# Patient Record
Sex: Female | Born: 1975 | Race: Black or African American | Hispanic: No | Smoking: Current every day smoker
Health system: Southern US, Community
[De-identification: ages and names within clinical notes are randomized; demographics above are authoritative.]

## PROBLEM LIST (undated history)

## (undated) DIAGNOSIS — J45909 Unspecified asthma, uncomplicated: Secondary | ICD-10-CM

## (undated) HISTORY — PX: APPENDECTOMY: SHX54

---

## 2005-03-06 ENCOUNTER — Emergency Department (HOSPITAL_COMMUNITY): Admission: EM | Admit: 2005-03-06 | Discharge: 2005-03-06 | Payer: Self-pay | Admitting: Family Medicine

## 2006-03-03 ENCOUNTER — Emergency Department (HOSPITAL_COMMUNITY): Admission: EM | Admit: 2006-03-03 | Discharge: 2006-03-03 | Payer: Self-pay | Admitting: Emergency Medicine

## 2006-07-30 ENCOUNTER — Emergency Department (HOSPITAL_COMMUNITY): Admission: EM | Admit: 2006-07-30 | Discharge: 2006-07-30 | Payer: Self-pay | Admitting: Emergency Medicine

## 2007-01-06 ENCOUNTER — Emergency Department (HOSPITAL_COMMUNITY): Admission: EM | Admit: 2007-01-06 | Discharge: 2007-01-07 | Payer: Self-pay | Admitting: Emergency Medicine

## 2011-01-21 ENCOUNTER — Inpatient Hospital Stay (HOSPITAL_COMMUNITY)
Admission: AD | Admit: 2011-01-21 | Discharge: 2011-01-22 | Disposition: A | Discharge status: Home / Self Care | Payer: Self-pay | Source: Ambulatory Visit | Attending: Obstetrics & Gynecology | Admitting: Obstetrics & Gynecology

## 2011-01-21 DIAGNOSIS — O210 Mild hyperemesis gravidarum: Secondary | ICD-10-CM

## 2011-01-21 LAB — URINALYSIS, ROUTINE W REFLEX MICROSCOPIC
Hgb urine dipstick: NEGATIVE
Ketones, ur: NEGATIVE mg/dL
Nitrite: NEGATIVE
Protein, ur: NEGATIVE mg/dL
Specific Gravity, Urine: <1.005 — ABNORMAL LOW (ref 1.005–1.030)
Urine Glucose, Fasting: NEGATIVE mg/dL
Urobilinogen, UA: 0.2 mg/dL (ref 0.0–1.0)

## 2011-01-21 LAB — POCT PREGNANCY, URINE: Preg Test, Ur: POSITIVE

## 2011-02-25 ENCOUNTER — Other Ambulatory Visit (HOSPITAL_COMMUNITY): Payer: Self-pay | Admitting: Obstetrics and Gynecology

## 2011-02-25 DIAGNOSIS — R772 Abnormality of alphafetoprotein: Secondary | ICD-10-CM

## 2011-02-25 DIAGNOSIS — Z0489 Encounter for examination and observation for other specified reasons: Secondary | ICD-10-CM

## 2011-03-15 ENCOUNTER — Other Ambulatory Visit (HOSPITAL_COMMUNITY): Payer: Self-pay | Admitting: Obstetrics and Gynecology

## 2011-03-15 ENCOUNTER — Ambulatory Visit (HOSPITAL_COMMUNITY)
Admission: RE | Admit: 2011-03-15 | Discharge: 2011-03-15 | Disposition: A | Discharge status: Home / Self Care | Payer: Medicaid Other | Source: Ambulatory Visit | Attending: *Deleted | Admitting: *Deleted

## 2011-03-15 ENCOUNTER — Ambulatory Visit (HOSPITAL_COMMUNITY)
Admission: RE | Admit: 2011-03-15 | Discharge: 2011-03-15 | Disposition: A | Discharge status: Home / Self Care | Payer: Medicaid Other | Source: Ambulatory Visit | Attending: Obstetrics and Gynecology | Admitting: Obstetrics and Gynecology

## 2011-03-15 DIAGNOSIS — Z363 Encounter for antenatal screening for malformations: Secondary | ICD-10-CM | POA: Insufficient documentation

## 2011-03-15 DIAGNOSIS — R772 Abnormality of alphafetoprotein: Secondary | ICD-10-CM

## 2011-03-15 DIAGNOSIS — Z0489 Encounter for examination and observation for other specified reasons: Secondary | ICD-10-CM

## 2011-03-15 DIAGNOSIS — Z1389 Encounter for screening for other disorder: Secondary | ICD-10-CM | POA: Insufficient documentation

## 2011-03-15 DIAGNOSIS — O358XX0 Maternal care for other (suspected) fetal abnormality and damage, not applicable or unspecified: Secondary | ICD-10-CM | POA: Insufficient documentation

## 2011-03-15 DIAGNOSIS — O289 Unspecified abnormal findings on antenatal screening of mother: Secondary | ICD-10-CM | POA: Insufficient documentation

## 2011-03-23 ENCOUNTER — Other Ambulatory Visit (HOSPITAL_COMMUNITY): Payer: Medicaid Other

## 2015-03-25 ENCOUNTER — Encounter (HOSPITAL_BASED_OUTPATIENT_CLINIC_OR_DEPARTMENT_OTHER): Payer: Self-pay | Admitting: *Deleted

## 2015-03-25 ENCOUNTER — Emergency Department (HOSPITAL_BASED_OUTPATIENT_CLINIC_OR_DEPARTMENT_OTHER)
Admission: EM | Admit: 2015-03-25 | Discharge: 2015-03-25 | Disposition: A | Discharge status: Home / Self Care | Payer: 59 | Attending: Emergency Medicine | Admitting: Emergency Medicine

## 2015-03-25 DIAGNOSIS — Z720 Tobacco use: Secondary | ICD-10-CM | POA: Diagnosis not present

## 2015-03-25 DIAGNOSIS — B349 Viral infection, unspecified: Secondary | ICD-10-CM | POA: Diagnosis not present

## 2015-03-25 DIAGNOSIS — R05 Cough: Secondary | ICD-10-CM | POA: Diagnosis present

## 2015-03-25 MED ORDER — ACETAMINOPHEN 500 MG PO TABS
1000.0000 mg | ORAL_TABLET | Freq: Once | ORAL | Status: AC
  Administered 2015-03-25: 1000 mg via ORAL
  Filled 2015-03-25: qty 2

## 2015-03-25 MED ORDER — FLUTICASONE PROPIONATE 50 MCG/ACT NA SUSP: 2.0000 | Freq: Every day | NASAL | Status: AC

## 2015-03-25 MED ORDER — LORATADINE 10 MG PO TABS
10.0000 mg | ORAL_TABLET | Freq: Once | ORAL | Status: AC
  Administered 2015-03-25: 10 mg via ORAL
  Filled 2015-03-25: qty 1

## 2015-03-25 MED ORDER — NAPROXEN 375 MG PO TABS: 375.0000 mg | ORAL_TABLET | Freq: Two times a day (BID) | ORAL | Status: AC

## 2015-03-25 MED ORDER — CETIRIZINE-PSEUDOEPHEDRINE ER 5-120 MG PO TB12: 1.0000 | ORAL_TABLET | Freq: Every day | ORAL | Status: AC

## 2015-03-25 MED ORDER — BENZONATATE 100 MG PO CAPS: 100.0000 mg | ORAL_CAPSULE | Freq: Three times a day (TID) | ORAL | Status: DC

## 2015-03-25 MED ORDER — BENZONATATE 100 MG PO CAPS
200.0000 mg | ORAL_CAPSULE | Freq: Once | ORAL | Status: AC
  Administered 2015-03-25: 200 mg via ORAL
  Filled 2015-03-25: qty 2

## 2015-03-25 MED ORDER — NAPROXEN 250 MG PO TABS
500.0000 mg | ORAL_TABLET | Freq: Once | ORAL | Status: AC
  Administered 2015-03-25: 500 mg via ORAL
  Filled 2015-03-25: qty 2

## 2015-03-25 NOTE — ED Notes (Signed)
Pt here with significant other and child, the whole family has URI.  Congestion and fever and chills

## 2015-03-25 NOTE — ED Provider Notes (Signed)
CSN: [id number]     Arrival date & time 03/25/15  0033 History   First MD Initiated Contact with Patient 03/25/15 0325     Chief Complaint  Patient presents with  . URI     (Consider location/radiation/quality/duration/timing/severity/associated sxs/prior Treatment) Patient is a 39 y.o. female presenting with URI. The history is provided by the patient.  URI Presenting symptoms: congestion, cough and rhinorrhea   Presenting symptoms comment:  Myalgias Congestion:    Location:  Nasal Cough:    Cough characteristics:  Non-productive   Severity:  Moderate   Onset quality:  Gradual   Duration:  3 days   Timing:  Intermittent   Progression:  Unchanged   Chronicity:  New Severity:  Moderate Onset quality:  Gradual Timing:  Constant Progression:  Unchanged Chronicity:  New Relieved by:  Nothing Worsened by:  Nothing tried Associated symptoms: myalgias and sneezing   Associated symptoms: no swollen glands and no wheezing   Risk factors: sick contacts   family members here with same.    History reviewed. No pertinent past medical history. History reviewed. No pertinent past surgical history. History reviewed. No pertinent family history. History  Substance Use Topics  . Smoking status: Current Every Day Smoker  . Smokeless tobacco: Not on file  . Alcohol Use: No   OB History    No data available     Review of Systems  HENT: Positive for congestion, rhinorrhea and sneezing. Negative for drooling.   Eyes: Negative for photophobia.  Respiratory: Positive for cough. Negative for wheezing.   Musculoskeletal: Positive for myalgias.  All other systems reviewed and are negative.     Allergies  Review of patient's allergies indicates no known allergies.  Home Medications   Prior to Admission medications   Not on File   BP 115/76 mmHg  Pulse 99  Temp(Src) 98.5 F (36.9 C)  Resp 22  Ht 5\' 3"  (1.6 m)  Wt 174 lb (78.926 kg)  BMI 30.83 kg/m2  SpO2 96%  LMP  03/18/2015 Physical Exam  Constitutional: She is oriented to person, place, and time. She appears well-developed. No distress.  HENT:  Head: Normocephalic and atraumatic.  Right Ear: External ear normal.  Left Ear: External ear normal.  Nose: Rhinorrhea present.  Mouth/Throat: Oropharynx is clear and moist.  Tm normal B  Eyes: Conjunctivae and EOM are normal. Pupils are equal, round, and reactive to light.  Neck: Normal range of motion. Neck supple.  Cardiovascular: Normal rate, regular rhythm and intact distal pulses.   Pulmonary/Chest: Effort normal and breath sounds normal. No stridor. No respiratory distress. She has no wheezes. She has no rales.  Abdominal: Soft. Bowel sounds are normal. There is no tenderness. There is no rebound and no guarding.  Musculoskeletal: Normal range of motion.  Lymphadenopathy:    She has no cervical adenopathy.  Neurological: She is alert and oriented to person, place, and time.  Skin: Skin is warm and dry.  Psychiatric: She has a normal mood and affect.    ED Course  Procedures (including critical care time) Labs Review Labs Reviewed - No data to display  Imaging Review No results found.   EKG Interpretation None      MDM   Final diagnoses:  None    Viral syndrome.  Well appearing.  Exam and vitals are normal.  Will treat symptomatically.    Cy BlamerApril Rawley Harju, MD 03/25/15 [phone]

## 2015-03-25 NOTE — ED Notes (Signed)
C/o cough, congestion body aches fever 101.5 onset monday

## 2016-02-27 ENCOUNTER — Encounter (HOSPITAL_BASED_OUTPATIENT_CLINIC_OR_DEPARTMENT_OTHER): Payer: Self-pay | Admitting: Emergency Medicine

## 2016-02-27 ENCOUNTER — Emergency Department (HOSPITAL_BASED_OUTPATIENT_CLINIC_OR_DEPARTMENT_OTHER)
Admission: EM | Admit: 2016-02-27 | Discharge: 2016-02-27 | Disposition: A | Discharge status: Home / Self Care | Payer: 59 | Attending: Emergency Medicine | Admitting: Emergency Medicine

## 2016-02-27 ENCOUNTER — Emergency Department (HOSPITAL_BASED_OUTPATIENT_CLINIC_OR_DEPARTMENT_OTHER): Payer: 59

## 2016-02-27 DIAGNOSIS — J45901 Unspecified asthma with (acute) exacerbation: Secondary | ICD-10-CM | POA: Insufficient documentation

## 2016-02-27 DIAGNOSIS — R0602 Shortness of breath: Secondary | ICD-10-CM | POA: Diagnosis present

## 2016-02-27 DIAGNOSIS — Z79899 Other long term (current) drug therapy: Secondary | ICD-10-CM | POA: Insufficient documentation

## 2016-02-27 DIAGNOSIS — R0789 Other chest pain: Secondary | ICD-10-CM | POA: Diagnosis not present

## 2016-02-27 DIAGNOSIS — Z791 Long term (current) use of non-steroidal anti-inflammatories (NSAID): Secondary | ICD-10-CM | POA: Diagnosis not present

## 2016-02-27 DIAGNOSIS — J069 Acute upper respiratory infection, unspecified: Secondary | ICD-10-CM | POA: Diagnosis not present

## 2016-02-27 DIAGNOSIS — Z7951 Long term (current) use of inhaled steroids: Secondary | ICD-10-CM | POA: Insufficient documentation

## 2016-02-27 DIAGNOSIS — F17200 Nicotine dependence, unspecified, uncomplicated: Secondary | ICD-10-CM | POA: Insufficient documentation

## 2016-02-27 DIAGNOSIS — J988 Other specified respiratory disorders: Secondary | ICD-10-CM

## 2016-02-27 DIAGNOSIS — B9789 Other viral agents as the cause of diseases classified elsewhere: Secondary | ICD-10-CM

## 2016-02-27 HISTORY — DX: Unspecified asthma, uncomplicated: J45.909

## 2016-02-27 MED ORDER — FENTANYL CITRATE (PF) 100 MCG/2ML IJ SOLN
100.0000 ug | Freq: Once | INTRAMUSCULAR | Status: AC
  Administered 2016-02-27: 100 ug via INTRAVENOUS
  Filled 2016-02-27: qty 2

## 2016-02-27 MED ORDER — SODIUM CHLORIDE 0.9 % IV BOLUS (SEPSIS)
1000.0000 mL | Freq: Once | INTRAVENOUS | Status: AC
  Administered 2016-02-27: 1000 mL via INTRAVENOUS

## 2016-02-27 MED ORDER — ONDANSETRON 8 MG PO TBDP: 8.0000 mg | ORAL_TABLET | Freq: Three times a day (TID) | ORAL | Status: AC | PRN

## 2016-02-27 MED ORDER — IBUPROFEN 800 MG PO TABS
800.0000 mg | ORAL_TABLET | Freq: Once | ORAL | Status: AC
  Administered 2016-02-27: 800 mg via ORAL
  Filled 2016-02-27: qty 1

## 2016-02-27 MED ORDER — HYDROCOD POLST-CPM POLST ER 10-8 MG/5ML PO SUER
5.0000 mL | Freq: Once | ORAL | Status: AC
  Administered 2016-02-27: 5 mL via ORAL
  Filled 2016-02-27: qty 5

## 2016-02-27 MED ORDER — ONDANSETRON HCL 4 MG/2ML IJ SOLN
4.0000 mg | Freq: Once | INTRAMUSCULAR | Status: AC
  Administered 2016-02-27: 4 mg via INTRAVENOUS
  Filled 2016-02-27: qty 2

## 2016-02-27 MED ORDER — HYDROCOD POLST-CPM POLST ER 10-8 MG/5ML PO SUER: 5.0000 mL | Freq: Two times a day (BID) | ORAL | Status: AC | PRN

## 2016-02-27 NOTE — ED Notes (Signed)
Pt report chest tighteness that progressed to SOB and chest pain onset 0800 the morning of the 24th

## 2016-02-27 NOTE — ED Notes (Signed)
C/o sob onset yesterday am and now is causing cp,  Pain increased w movement, palpation and inspiration

## 2016-02-27 NOTE — ED Provider Notes (Signed)
CSN: [id number]     Arrival date & time 02/27/16  4098 History   First MD Initiated Contact with Patient 02/27/16 0250     Chief Complaint  Patient presents with  . Shortness of Breath     (Consider location/radiation/quality/duration/timing/severity/associated sxs/prior Treatment) HPI  This is a 40 year old female with a one-day history of flulike symptoms. Specifically she has had cough, chest tightness, shortness of breath, fever, posttussive emesis, and chest wall pain worse with deep breathing. She has used albuterol at home with partial relief of her shortness of breath. She was noted to have a temperature of 102.8 on arrival and was given ibuprofen.   Past Medical History  Diagnosis Date  . Asthma    Past Surgical History  Procedure Laterality Date  . Appendectomy     History reviewed. No pertinent family history. Social History  Substance Use Topics  . Smoking status: Current Every Day Smoker  . Smokeless tobacco: None  . Alcohol Use: No   OB History    No data available     Review of Systems  All other systems reviewed and are negative.   Allergies  Review of patient's allergies indicates no known allergies.  Home Medications   Prior to Admission medications   Medication Sig Start Date End Date Taking? Authorizing Provider  benzonatate (TESSALON) 100 MG capsule Take 1 capsule (100 mg total) by mouth every 8 (eight) hours. 03/25/15   April Palumbo, MD  benzonatate (TESSALON) 100 MG capsule Take 1 capsule (100 mg total) by mouth every 8 (eight) hours. 03/25/15   April Palumbo, MD  cetirizine-pseudoephedrine (ZYRTEC-D) 5-120 MG per tablet Take 1 tablet by mouth daily. 03/25/15   April Palumbo, MD  fluticasone [facility]) 50 MCG/ACT nasal spray Place 2 sprays into both nostrils daily. 03/25/15   April Palumbo, MD  naproxen (NAPROSYN) 375 MG tablet Take 1 tablet (375 mg total) by mouth 2 (two) times daily. 03/25/15   April Palumbo, MD   BP 131/91 mmHg  Pulse 112   Temp(Src) 102.8 F (39.3 C) (Oral)  Wt 172 lb (78.019 kg)  SpO2 98%   Physical Exam  General: Well-developed, well-nourished female in no acute distress; appearance consistent with age of record HENT: normocephalic; atraumatic Eyes: pupils equal, round and reactive to light; extraocular muscles intact Neck: supple Heart: regular rate and rhythm; tachycardia Lungs: clear to auscultation bilaterally; tachypnea with shallow breaths Abdomen: soft; nondistended; nontender; no masses or hepatosplenomegaly; bowel sounds present Extremities: No deformity; full range of motion; pulses normal Neurologic: Awake, alert and oriented; motor function intact in all extremities and symmetric; no facial droop Skin: Warm and dry Psychiatric: Normal mood and affect    ED Course  Procedures (including critical care time)   MDM  Nursing notes and vitals signs, including pulse oximetry, reviewed.  Summary of this visit's results, reviewed by myself:   EKG Interpretation  Date/Time:  Saturday February 27 2016 02:50:42 EDT Ventricular Rate:  108 PR Interval:  166 QRS Duration: 97 QT Interval:  318 QTC Calculation: 426 R Axis:   44 Text Interpretation:  Sinus tachycardia RSR' in V1 or V2, right VCD or RVH No previous ECGs available Confirmed by Luigi Stuckey  MD, Jonny Ruiz ([id number]) on 02/27/2016 2:56:36 AM      Imaging Studies: Dg Chest 2 View  02/27/2016  CLINICAL DATA:  Chest tightness, shortness of breath, and midsternal chest pain. EXAM: CHEST  2 VIEW COMPARISON:  09/09/2010 FINDINGS: Normal heart size and pulmonary vascularity. No focal airspace disease  or consolidation in the lungs. Small linear fibrosis or atelectasis in the left lung base. No blunting of costophrenic angles. No pneumothorax. Mediastinal contours appear intact. Visualized bones appear intact. IMPRESSION: No evidence of active pulmonary disease. Electronically Signed   By: Burman NievesWilliam  Stevens M.D.   On: 02/27/2016 03:29   3:53 AM Patient's  pain significantly improved after IV fentanyl. Tachypnea has resolved.  Paula LibraJohn Danicia Terhaar, MD 02/27/16 [phone]

## 2016-03-28 ENCOUNTER — Emergency Department (HOSPITAL_BASED_OUTPATIENT_CLINIC_OR_DEPARTMENT_OTHER)
Admission: EM | Admit: 2016-03-28 | Discharge: 2016-03-29 | Disposition: A | Discharge status: Home / Self Care | Payer: 59 | Attending: Emergency Medicine | Admitting: Emergency Medicine

## 2016-03-28 ENCOUNTER — Encounter (HOSPITAL_BASED_OUTPATIENT_CLINIC_OR_DEPARTMENT_OTHER): Payer: Self-pay | Admitting: *Deleted

## 2016-03-28 DIAGNOSIS — J45909 Unspecified asthma, uncomplicated: Secondary | ICD-10-CM | POA: Insufficient documentation

## 2016-03-28 DIAGNOSIS — Z791 Long term (current) use of non-steroidal anti-inflammatories (NSAID): Secondary | ICD-10-CM | POA: Insufficient documentation

## 2016-03-28 DIAGNOSIS — H6121 Impacted cerumen, right ear: Secondary | ICD-10-CM | POA: Diagnosis not present

## 2016-03-28 DIAGNOSIS — F17200 Nicotine dependence, unspecified, uncomplicated: Secondary | ICD-10-CM | POA: Diagnosis not present

## 2016-03-28 DIAGNOSIS — Z79899 Other long term (current) drug therapy: Secondary | ICD-10-CM | POA: Insufficient documentation

## 2016-03-28 DIAGNOSIS — Z7951 Long term (current) use of inhaled steroids: Secondary | ICD-10-CM | POA: Diagnosis not present

## 2016-03-28 DIAGNOSIS — H938X1 Other specified disorders of right ear: Secondary | ICD-10-CM | POA: Diagnosis present

## 2016-03-28 NOTE — ED Notes (Signed)
Ear washed w warm h2o and peroxide,  Some wax removed  Pt states ear feels a lot better

## 2016-03-28 NOTE — ED Provider Notes (Signed)
CSN: [id number]     Arrival date & time 03/28/16  2114 History  By signing my name below, I, Carla Boone, attest that this documentation has been prepared under the direction and in the presence of Geoffery Lyonsouglas Amair Shrout, MD. Electronically Signed: Linus GalasMaharshi Boone, ED Scribe. 03/28/2016. 11:30 PM.   Chief Complaint  Patient presents with  . Ear Problem   The history is provided by the patient. No language interpreter was used.   HPI Comments: [name] is a 40 y.o. female who presents to the Emergency Department complaining of right ear discomfort that began today. Pt states she first used her nail to clean her ear and then a Qutip. Since then, its been sore and congested. She also reports a decrease in hearing. Pt denies any fevers, chills, SOB, N/V/D or any other symptoms at this time.    Pt has the flu last week.   Past Medical History  Diagnosis Date  . Asthma    Past Surgical History  Procedure Laterality Date  . Appendectomy     No family history on file. Social History  Substance Use Topics  . Smoking status: Current Every Day Smoker  . Smokeless tobacco: None  . Alcohol Use: No   OB History    No data available     Review of Systems A complete 10 system review of systems was obtained and all systems are negative except as noted in the HPI and PMH.   Allergies  Review of patient's allergies indicates no known allergies.  Home Medications   Prior to Admission medications   Medication Sig Start Date End Date Taking? Authorizing Provider  benzonatate (TESSALON) 100 MG capsule Take 1 capsule (100 mg total) by mouth every 8 (eight) hours. 03/25/15   April Palumbo, MD  benzonatate (TESSALON) 100 MG capsule Take 1 capsule (100 mg total) by mouth every 8 (eight) hours. 03/25/15   April Palumbo, MD  cetirizine-pseudoephedrine (ZYRTEC-D) 5-120 MG per tablet Take 1 tablet by mouth daily. 03/25/15   April Palumbo, MD  chlorpheniramine-HYDROcodone [facility](TUSSIONEX PENNKINETIC ER) 10-8  MG/5ML SUER Take 5 mLs by mouth every 12 (twelve) hours as needed. 02/27/16   John Molpus, MD  fluticasone (FLONASE) 50 MCG/ACT nasal spray Place 2 sprays into both nostrils daily. 03/25/15   April Palumbo, MD  naproxen (NAPROSYN) 375 MG tablet Take 1 tablet (375 mg total) by mouth 2 (two) times daily. 03/25/15   April Palumbo, MD  ondansetron (ZOFRAN ODT) 8 MG disintegrating tablet Take 1 tablet (8 mg total) by mouth every 8 (eight) hours as needed for nausea or vomiting. 02/27/16   John Molpus, MD   BP 122/74 mmHg  Pulse 73  Temp(Src) 98.4 F (36.9 C) (Oral)  Resp 16  Ht 5\' 3"  (1.6 m)  Wt 172 lb (78.019 kg)  BMI 30.48 kg/m2  SpO2 99%  LMP 03/24/2016   Physical Exam  Constitutional: She is oriented to person, place, and time. She appears well-developed and well-nourished.  HENT:  Head: Normocephalic and atraumatic.  Left TM and ear canal are clear. The right has to what appears to be a cerumen impaction. The TM is no visualized secondary to this.   Cardiovascular: Normal rate.   Pulmonary/Chest: Effort normal.  Neurological: She is alert and oriented to person, place, and time.  Skin: Skin is warm and dry.  Psychiatric: She has a normal mood and affect.  Nursing note and vitals reviewed.  ED Course  Procedures   DIAGNOSTIC STUDIES: Oxygen Saturation is 99%  on room air, normal by my interpretation.    COORDINATION OF CARE: 11:27 PM Discussed treatment plan with pt at bedside and pt agreed to plan.  MDM   Final diagnoses:  None    Ear was irrigated with warm water/peroxide and significant quantity of cerumen was obtained. She can now here and feels better. She'll be advised to use over-the-counter Debrox as needed and return for any problems.  I personally performed the services described in this documentation, which was scribed in my presence. The recorded information has been reviewed and is accurate.       Geoffery Lyons, MD 03/28/16 2350

## 2016-03-28 NOTE — ED Notes (Signed)
Right ear is stopped up since pushing a qtip in her ear. She used a was removal kit in her ear earlier today.

## 2016-03-28 NOTE — Discharge Instructions (Signed)
Debrox drops as per package instructions.  Follow-up with your primary Dr. for any problems.   Cerumen Impaction The structures of the external ear canal secrete a waxy substance known as cerumen. Excess cerumen can build up in the ear canal, causing a condition known as cerumen impaction. Cerumen impaction can cause ear pain and disrupt the function of the ear. The rate of cerumen production differs for each individual. In certain individuals, the configuration of the ear canal may decrease his or her ability to naturally remove cerumen. CAUSES Cerumen impaction is caused by excessive cerumen production or buildup. RISK FACTORS  Frequent use of swabs to clean ears.  Having narrow ear canals.  Having eczema.  Being dehydrated. SIGNS AND SYMPTOMS  Diminished hearing.  Ear drainage.  Ear pain.  Ear itch. TREATMENT Treatment may involve:  Over-the-counter or prescription ear drops to soften the cerumen.  Removal of cerumen by a health care provider. This may be done with:  Irrigation with warm water. This is the most common method of removal.  Ear curettes and other instruments.  Surgery. This may be done in severe cases. HOME CARE INSTRUCTIONS  Take medicines only as directed by your health care provider.  Do not insert objects into the ear with the intent of cleaning the ear. PREVENTION  Do not insert objects into the ear, even with the intent of cleaning the ear. Removing cerumen as a part of normal hygiene is not necessary, and the use of swabs in the ear canal is not recommended.  Drink enough water to keep your urine clear or pale yellow.  Control your eczema if you have it. SEEK MEDICAL CARE IF:  You develop ear pain.  You develop bleeding from the ear.  The cerumen does not clear after you use ear drops as directed.   This information is not intended to replace advice given to you by your health care provider. Make sure you discuss any questions you  have with your health care provider.   Document Released: 12/29/2004 Document Revised: 12/12/2014 Document Reviewed: 07/08/2015 Elsevier Interactive Patient Education [employer]2016 [employer]vier Inc.

## 2016-03-28 NOTE — ED Notes (Signed)
Felt something in rt ear,  Tried to remove w finger nail then used q tip  ? fb in ear

## 2017-11-05 ENCOUNTER — Encounter (HOSPITAL_BASED_OUTPATIENT_CLINIC_OR_DEPARTMENT_OTHER): Payer: Self-pay | Admitting: *Deleted

## 2017-11-05 ENCOUNTER — Emergency Department (HOSPITAL_BASED_OUTPATIENT_CLINIC_OR_DEPARTMENT_OTHER)
Admission: EM | Admit: 2017-11-05 | Discharge: 2017-11-05 | Disposition: A | Discharge status: Home / Self Care | Payer: 59 | Attending: Emergency Medicine | Admitting: Emergency Medicine

## 2017-11-05 ENCOUNTER — Other Ambulatory Visit: Payer: Self-pay

## 2017-11-05 ENCOUNTER — Emergency Department (HOSPITAL_BASED_OUTPATIENT_CLINIC_OR_DEPARTMENT_OTHER): Payer: 59

## 2017-11-05 DIAGNOSIS — F17200 Nicotine dependence, unspecified, uncomplicated: Secondary | ICD-10-CM | POA: Insufficient documentation

## 2017-11-05 DIAGNOSIS — J069 Acute upper respiratory infection, unspecified: Secondary | ICD-10-CM

## 2017-11-05 DIAGNOSIS — R05 Cough: Secondary | ICD-10-CM | POA: Diagnosis present

## 2017-11-05 DIAGNOSIS — Z79899 Other long term (current) drug therapy: Secondary | ICD-10-CM | POA: Diagnosis not present

## 2017-11-05 DIAGNOSIS — J45909 Unspecified asthma, uncomplicated: Secondary | ICD-10-CM | POA: Diagnosis not present

## 2017-11-05 MED ORDER — BENZONATATE 100 MG PO CAPS: 100.0000 mg | ORAL_CAPSULE | Freq: Three times a day (TID) | ORAL | 0 refills | Status: DC

## 2017-11-05 MED ORDER — PREDNISONE 20 MG PO TABS: 60.0000 mg | ORAL_TABLET | Freq: Every day | ORAL | 0 refills | Status: AC

## 2017-11-05 MED ORDER — BENZONATATE 100 MG PO CAPS: 100.0000 mg | ORAL_CAPSULE | Freq: Three times a day (TID) | ORAL | 0 refills | Status: AC

## 2017-11-05 MED ORDER — PREDNISONE 20 MG PO TABS: 60.0000 mg | ORAL_TABLET | Freq: Every day | ORAL | 0 refills | Status: DC

## 2017-11-05 NOTE — ED Provider Notes (Signed)
[facility] EMERGENCY DEPARTMENT Provider Note   CSN: [id number] Arrival date & time: 11/05/17  0849     History   Chief Complaint Chief Complaint  Patient presents with  . Cough    HPI Carla Boone is a 41 y.o. female with history of asthma who presents with a one-month history of cough.  Patient reports she started off with cold symptoms, which are now resolved, however she continues to have a dry cough, that is worse at night.  She is a smoker and has continued to smoke.  She has used her inhaler at times when she has wheezed.  She denies any fevers, chest pain, shortness of breath.   HPI  Past Medical History:  Diagnosis Date  . Asthma     There are no active problems to display for this patient.   Past Surgical History:  Procedure Laterality Date  . APPENDECTOMY      OB History    No data available       Home Medications    Prior to Admission medications   Medication Sig Start Date End Date Taking? Authorizing Provider  benzonatate (TESSALON) 100 MG capsule Take 1 capsule (100 mg total) by mouth every 8 (eight) hours. 11/05/17   Roberts Bon, Waylan BogaAlexandra M, PA-C  cetirizine-pseudoephedrine (ZYRTEC-D) 5-120 MG per tablet Take 1 tablet by mouth daily. 03/25/15   Palumbo, April, MD  chlorpheniramine-HYDROcodone (TUSSIONEX PENNKINETIC ER) 10-8 MG/5ML SUER Take 5 mLs by mouth every 12 (twelve) hours as needed. 02/27/16   Molpus, John, MD  fluticasone (FLONASE) 50 MCG/ACT nasal spray Place 2 sprays into both nostrils daily. 03/25/15   Palumbo, April, MD  naproxen (NAPROSYN) 375 MG tablet Take 1 tablet (375 mg total) by mouth 2 (two) times daily. 03/25/15   Palumbo, April, MD  ondansetron (ZOFRAN ODT) 8 MG disintegrating tablet Take 1 tablet (8 mg total) by mouth every 8 (eight) hours as needed for nausea or vomiting. 02/27/16   Molpus, Jonny RuizJohn, MD  predniSONE (DELTASONE) 20 MG tablet Take 3 tablets (60 mg total) by mouth daily with breakfast. 11/05/17   Vondell Sowell, Waylan BogaAlexandra M, PA-C      Family History No family history on file.  Social History Social History   Tobacco Use  . Smoking status: Current Every Day Smoker  Substance Use Topics  . Alcohol use: No  . Drug use: Not on file     Allergies   Patient has no known allergies.   Review of Systems Review of Systems  Constitutional: Negative for fever.  HENT: Negative for congestion.   Respiratory: Positive for cough. Negative for chest tightness and shortness of breath.   Cardiovascular: Negative for chest pain.     Physical Exam Updated Vital Signs BP 120/65 (BP Location: Left Arm)   Pulse 75   Temp 98.6 F (37 C) (Oral)   Resp 18   Ht 5\' 3"  (1.6 m)   Wt 75.8 kg (167 lb)   LMP 11/01/2017   SpO2 99%   BMI 29.58 kg/m   Physical Exam  Constitutional: She appears well-developed and well-nourished. No distress.  HENT:  Head: Normocephalic and atraumatic.  Mouth/Throat: Oropharynx is clear and moist. No oropharyngeal exudate.  Eyes: Conjunctivae are normal. Pupils are equal, round, and reactive to light. Right eye exhibits no discharge. Left eye exhibits no discharge. No scleral icterus.  Neck: Normal range of motion. Neck supple. No thyromegaly present.  Cardiovascular: Normal rate, regular rhythm, normal heart sounds and intact distal pulses. Exam reveals  no gallop and no friction rub.  No murmur heard. Pulmonary/Chest: Effort normal and breath sounds normal. No stridor. No respiratory distress. She has no wheezes. She has no rales.  Abdominal: Soft. Bowel sounds are normal. She exhibits no distension. There is no tenderness. There is no rebound and no guarding.  Musculoskeletal: She exhibits no edema.  Lymphadenopathy:    She has no cervical adenopathy.  Neurological: She is alert. Coordination normal.  Skin: Skin is warm and dry. No rash noted. She is not diaphoretic. No pallor.  Psychiatric: She has a normal mood and affect.  Nursing note and vitals reviewed.    ED Treatments /  Results  Labs (all labs ordered are listed, but only abnormal results are displayed) Labs Reviewed - No data to display  EKG  EKG Interpretation None       Radiology Dg Chest 2 View  Result Date: 11/05/2017 CLINICAL DATA:  Cough for 1 month EXAM: CHEST  2 VIEW COMPARISON:  02/27/2016 FINDINGS: Heart and mediastinal contours are within normal limits. No focal opacities or effusions. No acute bony abnormality. IMPRESSION: No active cardiopulmonary disease. Electronically Signed   By: Charlett NoseKevin  Dover M.D.   On: 11/05/2017 10:15    Procedures Procedures (including critical care time)  Medications Ordered in ED Medications - No data to display   Initial Impression / Assessment and Plan / ED Course  I have reviewed the triage vital signs and the nursing notes.  Pertinent labs & imaging results that were available during my care of the patient were reviewed by me and considered in my medical decision making (see chart for details).     Pt symptoms consistent with URI, probably bronchitis. CXR negative for acute infiltrate.  Smoking cessation discussed.  Will discharge home with 5-day burst of prednisone and Tessalon for symptomatic treatment.  Discussed return precautions.  Patient understands and agrees with plan.  Patient vitals stable throughout ED course and discharged in satisfactory condition.  Final Clinical Impressions(s) / ED Diagnoses   Final diagnoses:  Upper respiratory tract infection, unspecified type    ED Discharge Orders        Ordered    predniSONE (DELTASONE) 20 MG tablet  Daily with breakfast,   Status:  Discontinued     11/05/17 1042    benzonatate (TESSALON) 100 MG capsule  Every 8 hours,   Status:  Discontinued     11/05/17 1042    benzonatate (TESSALON) 100 MG capsule  Every 8 hours     11/05/17 1048    predniSONE (DELTASONE) 20 MG tablet  Daily with breakfast     11/05/17 [id number]       Danzel Marszalek M, PA-C 11/05/17 1059    Little, Ambrose Finlandachel Morgan,  MD 11/05/17 2109

## 2017-11-05 NOTE — Discharge Instructions (Signed)
Take prednisone as prescribed for 5 days.  You can take Tessalon every 8 hours as needed for cough.  Please follow-up and establish care with a primary care provider by calling number circled on your discharge paperwork.  Please return to emergency department if you develop any new or worsening symptoms.

## 2017-11-05 NOTE — ED Triage Notes (Signed)
Pt c/o cough x 1 month

## 2017-11-05 NOTE — ED Notes (Signed)
Patient transported to X-ray 

## 2018-01-02 ENCOUNTER — Emergency Department (HOSPITAL_BASED_OUTPATIENT_CLINIC_OR_DEPARTMENT_OTHER)
Admission: EM | Admit: 2018-01-02 | Discharge: 2018-01-02 | Disposition: A | Discharge status: Home / Self Care | Payer: 59 | Attending: Emergency Medicine | Admitting: Emergency Medicine

## 2018-01-02 ENCOUNTER — Encounter (HOSPITAL_BASED_OUTPATIENT_CLINIC_OR_DEPARTMENT_OTHER): Payer: Self-pay | Admitting: *Deleted

## 2018-01-02 ENCOUNTER — Other Ambulatory Visit: Payer: Self-pay

## 2018-01-02 DIAGNOSIS — R51 Headache: Secondary | ICD-10-CM | POA: Insufficient documentation

## 2018-01-02 DIAGNOSIS — J45909 Unspecified asthma, uncomplicated: Secondary | ICD-10-CM | POA: Diagnosis not present

## 2018-01-02 DIAGNOSIS — H538 Other visual disturbances: Secondary | ICD-10-CM | POA: Insufficient documentation

## 2018-01-02 DIAGNOSIS — Z79899 Other long term (current) drug therapy: Secondary | ICD-10-CM | POA: Insufficient documentation

## 2018-01-02 DIAGNOSIS — F17200 Nicotine dependence, unspecified, uncomplicated: Secondary | ICD-10-CM | POA: Diagnosis not present

## 2018-01-02 DIAGNOSIS — H1032 Unspecified acute conjunctivitis, left eye: Secondary | ICD-10-CM | POA: Diagnosis not present

## 2018-01-02 DIAGNOSIS — H5789 Other specified disorders of eye and adnexa: Secondary | ICD-10-CM | POA: Diagnosis present

## 2018-01-02 MED ORDER — FLUORESCEIN SODIUM 1 MG OP STRP
1.0000 | ORAL_STRIP | Freq: Once | OPHTHALMIC | Status: AC
  Administered 2018-01-02: 1 via OPHTHALMIC
  Filled 2018-01-02: qty 1

## 2018-01-02 MED ORDER — POLYMYXIN B-TRIMETHOPRIM 10000-0.1 UNIT/ML-% OP SOLN: 1.0000 [drp] | OPHTHALMIC | 0 refills | Status: AC

## 2018-01-02 MED ORDER — TETRACAINE HCL 0.5 % OP SOLN
2.0000 [drp] | Freq: Once | OPHTHALMIC | Status: AC
  Administered 2018-01-02: 2 [drp] via OPHTHALMIC
  Filled 2018-01-02: qty 4

## 2018-01-02 NOTE — ED Triage Notes (Signed)
Left eye lid is swollen. Eye is red and draining.

## 2018-01-02 NOTE — ED Notes (Signed)
20/200 left eye 20/70   Right eye 20/50   Both eyes

## 2018-01-02 NOTE — ED Notes (Signed)
RN Jasmine DecemberSharon completed the visual acuity

## 2018-01-02 NOTE — Discharge Instructions (Signed)
You do not have a corneal abrasion or ulcer. Pressure inside your eye is normal.   Conjunctivitis is contagious, please wash your hands regularly.   Please place antibiotic drops in the eyes for a week.   Return to the ER if you have worsening redness and swelling, fever >100.41F, vomiting that will not stop or have any new or worsening symptoms.

## 2018-01-02 NOTE — ED Provider Notes (Signed)
[facility] EMERGENCY DEPARTMENT Provider Note   CSN: [id number] Arrival date & time: 01/02/18  1239     History   Chief Complaint Chief Complaint  Patient presents with  . Eye Problem    HPI Carla Boone is a 42 y.o. female.  HPI   Carla Boone is a 42yo female with no significant past medical history who presents to the Emergency Department for evaluation of left eye redness and lid swelling. Patient states that she noticed her right eye felt "irritated" when she was walking outside four days ago. States it felt like something got caught in her eye. The next day she noticed the left eye was red. Two days ago the left upper lid became swollen. She states her eye feels "sore." States she has clear discharge coming from the eye which is worse in the morning. Endorses eyes crusted shut when she wakes up. Also endorses some blurred vision which improves some when she rubs away tears. States her father had pink eye recently. Thinks her right eye may becoming red as well. She is not a contact lens user. Endorses mild frontal headache. Denies photophobia, n/v, eye itching, fever, chills. Denies previous eye surgeries.   Past Medical History:  Diagnosis Date  . Asthma     There are no active problems to display for this patient.   Past Surgical History:  Procedure Laterality Date  . APPENDECTOMY      OB History    No data available       Home Medications    Prior to Admission medications   Medication Sig Start Date End Date Taking? Authorizing Provider  benzonatate (TESSALON) 100 MG capsule Take 1 capsule (100 mg total) by mouth every 8 (eight) hours. 11/05/17   Law, Waylan Boga, PA-C  cetirizine-pseudoephedrine (ZYRTEC-D) 5-120 MG per tablet Take 1 tablet by mouth daily. 03/25/15   Palumbo, April, MD  chlorpheniramine-HYDROcodone (TUSSIONEX PENNKINETIC ER) 10-8 MG/5ML SUER Take 5 mLs by mouth every 12 (twelve) hours as needed. 02/27/16   Molpus, John, MD  fluticasone  (FLONASE) 50 MCG/ACT nasal spray Place 2 sprays into both nostrils daily. 03/25/15   Palumbo, April, MD  naproxen (NAPROSYN) 375 MG tablet Take 1 tablet (375 mg total) by mouth 2 (two) times daily. 03/25/15   Palumbo, April, MD  ondansetron (ZOFRAN ODT) 8 MG disintegrating tablet Take 1 tablet (8 mg total) by mouth every 8 (eight) hours as needed for nausea or vomiting. 02/27/16   Molpus, Jonny Ruiz, MD  predniSONE (DELTASONE) 20 MG tablet Take 3 tablets (60 mg total) by mouth daily with breakfast. 11/05/17   Law, Waylan Boga, PA-C    Family History No family history on file.  Social History Social History   Tobacco Use  . Smoking status: Current Every Day Smoker  . Smokeless tobacco: Never Used  Substance Use Topics  . Alcohol use: No  . Drug use: No     Allergies   Patient has no known allergies.   Review of Systems Review of Systems  Constitutional: Negative for fever.  Eyes: Positive for pain, discharge, redness and visual disturbance (blurred vision with eye discharge. ). Negative for photophobia and itching.  Gastrointestinal: Negative for nausea and vomiting.  Skin: Negative for wound.     Physical Exam Updated Vital Signs BP 111/83   Pulse 79   Temp 98.5 F (36.9 C) (Oral)   Resp 18   Ht 5\' 3"  (1.6 m)   Wt 75.8 kg (167 lb)  LMP 12/13/2017   SpO2 100%   BMI 29.58 kg/m   Physical Exam  Constitutional: She appears well-developed and well-nourished. No distress.  HENT:  Head: Normocephalic and atraumatic.  Eyes: Right eye exhibits no discharge. Left eye exhibits no discharge.  Appearance. Bilateral eyes with injected conjunctiva and clear discharge draining. Left eye with swelling over the lid which is mildly tender to the touch. No palpable stye. PERRL intact. EOMI without nystagmus. No consensual photophobia.  Corneal Abrasion Exam VCO. Risks, benefits and alternatives explained. 2 drops of tetracaine (PONTOCAINE) 0.5 % ophthalmic solution were applied to  bilateral eyes. Fluorescein 1 MG ophthalmic strip applied the the surface of the bilateral eye Woods lamp used to screen for abrasion. No increased fluorescein uptake. No corneal ulcer. No hyphema. No dendritic lesion. Negative Seidel sign. No foreign bodies noted. No visible hyphema.  Patient tolerated the procedure well TONOPEN: 14 RE 16LE  Pulmonary/Chest: Effort normal. No respiratory distress.  Neurological: She is alert. Coordination normal.  Skin: She is not diaphoretic.  Psychiatric: She has a normal mood and affect. Her behavior is normal.  Nursing note and vitals reviewed.    ED Treatments / Results  Labs (all labs ordered are listed, but only abnormal results are displayed) Labs Reviewed - No data to display  EKG  EKG Interpretation None       Radiology No results found.  Procedures Procedures (including critical care time)  Medications Ordered in ED Medications  fluorescein ophthalmic strip 1 strip (1 strip Both Eyes Given 01/02/18 1616)  tetracaine (PONTOCAINE) 0.5 % ophthalmic solution 2 drop (2 drops Both Eyes Given 01/02/18 1616)     Initial Impression / Assessment and Plan / ED Course  I have reviewed the triage vital signs and the nursing notes.  Pertinent labs & imaging results that were available during my care of the patient were reviewed by me and considered in my medical decision making (see chart for details).    Presentation consistent with conjunctivitis.  No corneal abrasions, entrapment, consensual photophobia, or dendritic staining with fluorescein study.  Presentation non-concerning for iritis, corneal abrasions, or HSV. She has mild lid swelling, no palpable stye. No erythema, warmth, induration to suggest pre-septal cellulitis. Personal hygiene and frequent handwashing discussed. Will d/c with polytrim drops. Patient advised to followup with ophthalmologist if symptoms persist or worsen in any way including vision change.  Patient verbalizes  understanding and is agreeable with discharge.  Final Clinical Impressions(s) / ED Diagnoses   Final diagnoses:  Acute conjunctivitis of left eye, unspecified acute conjunctivitis type    ED Discharge Orders        Ordered    trimethoprim-polymyxin b (POLYTRIM) ophthalmic solution  Every 4 hours     01/02/18 1649       Kellie ShropshireShrosbree, Dnaiel Voller J, PA-C 01/02/18 1657    Tegeler, Canary Brimhristopher J, MD 01/03/18 [phone]

## 2018-02-12 ENCOUNTER — Encounter (HOSPITAL_BASED_OUTPATIENT_CLINIC_OR_DEPARTMENT_OTHER): Payer: Self-pay | Admitting: *Deleted

## 2018-02-12 ENCOUNTER — Other Ambulatory Visit: Payer: Self-pay

## 2018-02-12 ENCOUNTER — Emergency Department (HOSPITAL_BASED_OUTPATIENT_CLINIC_OR_DEPARTMENT_OTHER)
Admission: EM | Admit: 2018-02-12 | Discharge: 2018-02-12 | Disposition: A | Discharge status: Home / Self Care | Payer: 59 | Attending: Emergency Medicine | Admitting: Emergency Medicine

## 2018-02-12 DIAGNOSIS — J45909 Unspecified asthma, uncomplicated: Secondary | ICD-10-CM | POA: Diagnosis not present

## 2018-02-12 DIAGNOSIS — R350 Frequency of micturition: Secondary | ICD-10-CM | POA: Diagnosis present

## 2018-02-12 DIAGNOSIS — Z79899 Other long term (current) drug therapy: Secondary | ICD-10-CM | POA: Insufficient documentation

## 2018-02-12 DIAGNOSIS — F17210 Nicotine dependence, cigarettes, uncomplicated: Secondary | ICD-10-CM | POA: Diagnosis not present

## 2018-02-12 DIAGNOSIS — N390 Urinary tract infection, site not specified: Secondary | ICD-10-CM

## 2018-02-12 LAB — URINALYSIS, ROUTINE W REFLEX MICROSCOPIC
Bilirubin Urine: NEGATIVE
Glucose, UA: 100 mg/dL — AB
Hgb urine dipstick: NEGATIVE
KETONES UR: NEGATIVE mg/dL
NITRITE: POSITIVE — AB
PH: 7 (ref 5.0–8.0)
PROTEIN: NEGATIVE mg/dL
Specific Gravity, Urine: 1.01 (ref 1.005–1.030)

## 2018-02-12 LAB — URINALYSIS, MICROSCOPIC (REFLEX)

## 2018-02-12 LAB — PREGNANCY, URINE: Preg Test, Ur: NEGATIVE

## 2018-02-12 MED ORDER — CEPHALEXIN 500 MG PO CAPS: 500.0000 mg | ORAL_CAPSULE | Freq: Four times a day (QID) | ORAL | 0 refills | Status: DC

## 2018-02-12 MED ORDER — CEPHALEXIN 250 MG PO CAPS
500.0000 mg | ORAL_CAPSULE | Freq: Once | ORAL | Status: AC
  Administered 2018-02-12: 500 mg via ORAL
  Filled 2018-02-12: qty 2

## 2018-02-12 NOTE — ED Triage Notes (Signed)
Lower back pain. Urine frequency.

## 2018-02-12 NOTE — ED Provider Notes (Signed)
[facility] EMERGENCY DEPARTMENT Provider Note   CSN: [id number] Arrival date & time: 02/12/18  1915     History   Chief Complaint Chief Complaint  Patient presents with  . Back Pain    HPI Carla HoughLateefah Boone is a 42 y.o. female.  Patient is a 42 year old female with a history of asthma who presents with urinary symptoms.  She had a 2-day history of some urinary urgency and frequency.  Today she started having some pain in her left back.  There is no abdominal pain.  No flank pain.  No fevers.  No nausea or vomiting.  She has had similar symptoms with urinary tract infections in the past.  No history of kidney stones.  She is been using AZO without improvement in symptoms.      Past Medical History:  Diagnosis Date  . Asthma     There are no active problems to display for this patient.   Past Surgical History:  Procedure Laterality Date  . APPENDECTOMY      OB History    No data available       Home Medications    Prior to Admission medications   Medication Sig Start Date End Date Taking? Authorizing Provider  benzonatate (TESSALON) 100 MG capsule Take 1 capsule (100 mg total) by mouth every 8 (eight) hours. 11/05/17   Law, Waylan BogaAlexandra M, PA-C  cephALEXin (KEFLEX) 500 MG capsule Take 1 capsule (500 mg total) by mouth 4 (four) times daily. 02/12/18   Rolan BuccoBelfi, Masha Orbach, MD  cetirizine-pseudoephedrine (ZYRTEC-D) 5-120 MG per tablet Take 1 tablet by mouth daily. 03/25/15   Palumbo, April, MD  chlorpheniramine-HYDROcodone (TUSSIONEX PENNKINETIC ER) 10-8 MG/5ML SUER Take 5 mLs by mouth every 12 (twelve) hours as needed. 02/27/16   Molpus, John, MD  fluticasone (FLONASE) 50 MCG/ACT nasal spray Place 2 sprays into both nostrils daily. 03/25/15   Palumbo, April, MD  naproxen (NAPROSYN) 375 MG tablet Take 1 tablet (375 mg total) by mouth 2 (two) times daily. 03/25/15   Palumbo, April, MD  ondansetron (ZOFRAN ODT) 8 MG disintegrating tablet Take 1 tablet (8 mg total) by mouth  every 8 (eight) hours as needed for nausea or vomiting. 02/27/16   Molpus, Jonny RuizJohn, MD  predniSONE (DELTASONE) 20 MG tablet Take 3 tablets (60 mg total) by mouth daily with breakfast. 11/05/17   Law, Waylan BogaAlexandra M, PA-C  trimethoprim-polymyxin b (POLYTRIM) ophthalmic solution Place 1 drop into both eyes every 4 (four) hours. 01/02/18   Kellie ShropshireShrosbree, Emily J, PA-C    Family History No family history on file.  Social History Social History   Tobacco Use  . Smoking status: Current Every Day Smoker  . Smokeless tobacco: Never Used  Substance Use Topics  . Alcohol use: No  . Drug use: No     Allergies   Patient has no known allergies.   Review of Systems Review of Systems  Constitutional: Negative for chills, diaphoresis, fatigue and fever.  HENT: Negative for congestion, rhinorrhea and sneezing.   Eyes: Negative.   Respiratory: Negative for cough, chest tightness and shortness of breath.   Cardiovascular: Negative for chest pain and leg swelling.  Gastrointestinal: Negative for abdominal pain, blood in stool, diarrhea, nausea and vomiting.  Genitourinary: Positive for frequency and urgency. Negative for difficulty urinating, flank pain and hematuria.  Musculoskeletal: Positive for back pain. Negative for arthralgias.  Skin: Negative for rash.  Neurological: Negative for dizziness, speech difficulty, weakness, numbness and headaches.     Physical Exam Updated  Vital Signs BP 110/74   Pulse 76   Temp 97.8 F (36.6 C) (Oral)   Resp 20   Ht 5\' 3"  (1.6 m)   Wt 75.8 kg (167 lb)   LMP 01/16/2018   SpO2 100%   BMI 29.58 kg/m   Physical Exam  Constitutional: She is oriented to person, place, and time. She appears well-developed and well-nourished.  HENT:  Head: Normocephalic and atraumatic.  Eyes: Pupils are equal, round, and reactive to light.  Neck: Normal range of motion. Neck supple.  Cardiovascular: Normal rate, regular rhythm and normal heart sounds.  Pulmonary/Chest: Effort  normal and breath sounds normal. No respiratory distress. She has no wheezes. She has no rales. She exhibits no tenderness.  Abdominal: Soft. Bowel sounds are normal. There is no tenderness. There is no rebound and no guarding.  Musculoskeletal: Normal range of motion. She exhibits no edema.  Mild tenderness to the left mid back  Lymphadenopathy:    She has no cervical adenopathy.  Neurological: She is alert and oriented to person, place, and time.  Skin: Skin is warm and dry. No rash noted.  Psychiatric: She has a normal mood and affect.     ED Treatments / Results  Labs (all labs ordered are listed, but only abnormal results are displayed) Labs Reviewed  URINALYSIS, ROUTINE W REFLEX MICROSCOPIC - Abnormal; Notable for the following components:      Result Value   Color, Urine ORANGE (*)    Glucose, UA 100 (*)    Nitrite POSITIVE (*)    Leukocytes, UA TRACE (*)    All other components within normal limits  URINALYSIS, MICROSCOPIC (REFLEX) - Abnormal; Notable for the following components:   Bacteria, UA RARE (*)    Squamous Epithelial / LPF 0-5 (*)    All other components within normal limits  URINE CULTURE  PREGNANCY, URINE    EKG  EKG Interpretation None       Radiology No results found.  Procedures Procedures (including critical care time)  Medications Ordered in ED Medications  cephALEXin (KEFLEX) capsule 500 mg (not administered)     Initial Impression / Assessment and Plan / ED Course  I have reviewed the triage vital signs and the nursing notes.  Pertinent labs & imaging results that were available during my care of the patient were reviewed by me and considered in my medical decision making (see chart for details).     Patient presents with back pain and urinary symptoms.  Her urine is consistent with a UTI.  She does not have any other symptoms that would be more concerning for pyelonephritis.  No fevers or vomiting.  She is otherwise well-appearing.   She was started on Keflex.  Her urine was sent for culture.  Return precautions were given.  Final Clinical Impressions(s) / ED Diagnoses   Final diagnoses:  Lower urinary tract infectious disease    ED Discharge Orders        Ordered    cephALEXin (KEFLEX) 500 MG capsule  4 times daily     02/12/18 2235       Rolan Bucco, MD 02/12/18 2333

## 2018-02-14 LAB — URINE CULTURE: CULTURE: NO GROWTH

## 2020-08-24 ENCOUNTER — Encounter (HOSPITAL_BASED_OUTPATIENT_CLINIC_OR_DEPARTMENT_OTHER): Payer: Self-pay | Admitting: *Deleted

## 2020-08-24 ENCOUNTER — Emergency Department (HOSPITAL_BASED_OUTPATIENT_CLINIC_OR_DEPARTMENT_OTHER)
Admission: EM | Admit: 2020-08-24 | Discharge: 2020-08-24 | Disposition: A | Discharge status: Home / Self Care | Payer: 59 | Attending: Emergency Medicine | Admitting: Emergency Medicine

## 2020-08-24 ENCOUNTER — Other Ambulatory Visit: Payer: Self-pay

## 2020-08-24 DIAGNOSIS — Z5321 Procedure and treatment not carried out due to patient leaving prior to being seen by health care provider: Secondary | ICD-10-CM | POA: Insufficient documentation

## 2020-08-24 DIAGNOSIS — M546 Pain in thoracic spine: Secondary | ICD-10-CM | POA: Insufficient documentation

## 2020-08-24 NOTE — ED Triage Notes (Signed)
Pain in her upper back x 4 days. No injury.

## 2022-04-24 ENCOUNTER — Emergency Department (HOSPITAL_BASED_OUTPATIENT_CLINIC_OR_DEPARTMENT_OTHER)
Admission: EM | Admit: 2022-04-24 | Discharge: 2022-04-24 | Disposition: A | Discharge status: Home / Self Care | Payer: 59 | Attending: Emergency Medicine | Admitting: Emergency Medicine

## 2022-04-24 ENCOUNTER — Other Ambulatory Visit: Payer: Self-pay

## 2022-04-24 ENCOUNTER — Encounter (HOSPITAL_BASED_OUTPATIENT_CLINIC_OR_DEPARTMENT_OTHER): Payer: Self-pay | Admitting: Emergency Medicine

## 2022-04-24 ENCOUNTER — Emergency Department (HOSPITAL_BASED_OUTPATIENT_CLINIC_OR_DEPARTMENT_OTHER): Payer: 59

## 2022-04-24 DIAGNOSIS — M62830 Muscle spasm of back: Secondary | ICD-10-CM | POA: Insufficient documentation

## 2022-04-24 DIAGNOSIS — M546 Pain in thoracic spine: Secondary | ICD-10-CM | POA: Diagnosis not present

## 2022-04-24 LAB — PREGNANCY, URINE: Preg Test, Ur: NEGATIVE

## 2022-04-24 MED ORDER — CYCLOBENZAPRINE HCL 10 MG PO TABS: 10.0000 mg | ORAL_TABLET | Freq: Two times a day (BID) | ORAL | 0 refills | Status: AC | PRN

## 2022-04-24 MED ORDER — OXYCODONE HCL 5 MG PO TABS
5.0000 mg | ORAL_TABLET | Freq: Once | ORAL | Status: AC
  Administered 2022-04-24: 5 mg via ORAL
  Filled 2022-04-24: qty 1

## 2022-04-24 MED ORDER — OXYCODONE HCL 5 MG PO TABS: 5.0000 mg | ORAL_TABLET | Freq: Four times a day (QID) | ORAL | 0 refills | Status: AC | PRN

## 2022-04-24 MED ORDER — KETOROLAC TROMETHAMINE 60 MG/2ML IM SOLN
60.0000 mg | Freq: Once | INTRAMUSCULAR | Status: AC
  Administered 2022-04-24: 60 mg via INTRAMUSCULAR
  Filled 2022-04-24: qty 2

## 2022-04-24 MED ORDER — METHYLPREDNISOLONE 4 MG PO TBPK: ORAL_TABLET | ORAL | 0 refills | Status: AC

## 2022-04-24 NOTE — ED Notes (Signed)
Patient reports pain 9 out of 10 with movement, states while she is at rest her pain is a 4 out of 10.

## 2022-04-24 NOTE — ED Notes (Signed)
Patient c/o right shoulder pain since Friday, states she thought she may have slept wrong, but reports pain has increased over the last several days. Patient c/o pain with movement. Has been taking tylenol for pain without relief.

## 2022-04-24 NOTE — Discharge Instructions (Addendum)
Follow-up with sports medicine.  Overall suspect that this is a bad muscle spasm.  Cardiac work-up was unremarkable.  No signs of evidence of pneumonia on chest x-ray.  I have prescribed your narcotic pain medicine called Roxicodone as well as a muscle relaxant called Flexeril.  These medications are sedating.  Please be careful with their use and would not recommend any dangerous activities including driving.  Take steroids as prescribed.  Recommend 1000 mg of Tylenol every 6 hours as needed for pain as well.  Recommend trying to massage this area as well with a foam roller or with the help with somebody else.

## 2022-04-24 NOTE — ED Provider Notes (Signed)
[facility] EMERGENCY DEPARTMENT Provider Note   CSN: [id number] Arrival date & time: 04/24/22  1304     History  Chief Complaint  Patient presents with   Back Pain    Carla Boone is a 46 y.o. female.  Patient here with pain to her right upper back.  Worse with movement.  Over-the-counter medications have not helped.  Pain for the last several days.  Works as a Surveyor, quantity.  Denies any trauma.  Not on birth control.  No chest pain or shortness of breath.  No fever chills or cough.  No recent travel or surgery.  Has never had a blood clot or any cardiac history in the past.  Denies any abdominal pain, extremity pain otherwise.  No numbness or weakness.       Home Medications Prior to Admission medications   Medication Sig Start Date End Date Taking? Authorizing Provider  cyclobenzaprine (FLEXERIL) 10 MG tablet Take 1 tablet (10 mg total) by mouth 2 (two) times daily as needed for muscle spasms. 04/24/22  Yes Ninah Moccio, DO  methylPREDNISolone (MEDROL DOSEPAK) 4 MG TBPK tablet Follow package insert 04/24/22  Yes Kole Hilyard, DO  oxyCODONE (ROXICODONE) 5 MG immediate release tablet Take 1 tablet (5 mg total) by mouth every 6 (six) hours as needed for up to 10 days for breakthrough pain. 04/24/22 05/04/22 Yes Arnetia Bronk, DO  benzonatate (TESSALON) 100 MG capsule Take 1 capsule (100 mg total) by mouth every 8 (eight) hours. 11/05/17   Law, Waylan Boga, PA-C  cephALEXin (KEFLEX) 500 MG capsule Take 1 capsule (500 mg total) by mouth 4 (four) times daily. 02/12/18   Rolan Bucco, MD  cetirizine-pseudoephedrine (ZYRTEC-D) 5-120 MG per tablet Take 1 tablet by mouth daily. 03/25/15   Palumbo, April, MD  chlorpheniramine-HYDROcodone (TUSSIONEX PENNKINETIC ER) 10-8 MG/5ML SUER Take 5 mLs by mouth every 12 (twelve) hours as needed. 02/27/16   Molpus, John, MD  fluticasone (FLONASE) 50 MCG/ACT nasal spray Place 2 sprays into both nostrils daily. 03/25/15   Palumbo, April, MD   naproxen (NAPROSYN) 375 MG tablet Take 1 tablet (375 mg total) by mouth 2 (two) times daily. 03/25/15   Palumbo, April, MD  ondansetron (ZOFRAN ODT) 8 MG disintegrating tablet Take 1 tablet (8 mg total) by mouth every 8 (eight) hours as needed for nausea or vomiting. 02/27/16   Molpus, Jonny Ruiz, MD  predniSONE (DELTASONE) 20 MG tablet Take 3 tablets (60 mg total) by mouth daily with breakfast. 11/05/17   Law, Waylan Boga, PA-C  trimethoprim-polymyxin b (POLYTRIM) ophthalmic solution Place 1 drop into both eyes every 4 (four) hours. 01/02/18   Kellie Shropshire, PA-C      Allergies    Other and Oxycodone-acetaminophen    Review of Systems   Review of Systems  Physical Exam Updated Vital Signs BP (!) 138/91 (BP Location: Left Arm)   Pulse 72   Temp 98.4 F (36.9 C) (Oral)   Resp 17   LMP  (LMP Unknown)   SpO2 100%  Physical Exam Vitals and nursing note reviewed.  Constitutional:      General: She is not in acute distress.    Appearance: She is well-developed. She is not ill-appearing.  HENT:     Head: Normocephalic and atraumatic.  Eyes:     Conjunctiva/sclera: Conjunctivae normal.     Pupils: Pupils are equal, round, and reactive to light.  Cardiovascular:     Rate and Rhythm: Normal rate and regular rhythm.     Heart  sounds: No murmur heard. Pulmonary:     Effort: Pulmonary effort is normal. No respiratory distress.     Breath sounds: Normal breath sounds.  Abdominal:     Palpations: Abdomen is soft.     Tenderness: There is no abdominal tenderness.  Musculoskeletal:        General: Tenderness present. No swelling.     Cervical back: Neck supple.     Comments: Tenderness to the right subscapularis region into the right trapezius, right paraspinal cervical muscles  Skin:    General: Skin is warm and dry.     Capillary Refill: Capillary refill takes less than 2 seconds.  Neurological:     General: No focal deficit present.     Mental Status: She is alert.     Sensory: No  sensory deficit.     Motor: No weakness.     Comments: 5+ out of 5 strength throughout, normal sensation  Psychiatric:        Mood and Affect: Mood normal.    ED Results / Procedures / Treatments   Labs (all labs ordered are listed, but only abnormal results are displayed) Labs Reviewed  PREGNANCY, URINE    EKG EKG Interpretation  Date/Time:  Sunday Apr 24 2022 13:36:59 EDT Ventricular Rate:  71 PR Interval:  180 QRS Duration: 95 QT Interval:  418 QTC Calculation: 455 R Axis:   61 Text Interpretation: Sinus rhythm Probable left atrial enlargement RSR' in V1 or V2, right VCD or RVH Confirmed by Virgina Norfolk [phone]) on 04/24/2022 1:40:32 PM  Radiology DG Chest Portable 1 View  Result Date: 04/24/2022 CLINICAL DATA:  Pain in right upper back. EXAM: PORTABLE CHEST 1 VIEW COMPARISON:  November 05, 2017 FINDINGS: The heart size and mediastinal contours are within normal limits. Both lungs are clear. The visualized skeletal structures are unremarkable. IMPRESSION: No active disease. Electronically Signed   By: Gerome Sam III M.D.   On: 04/24/2022 13:49    Procedures Procedures    Medications Ordered in ED Medications  ketorolac (TORADOL) injection 60 mg (60 mg Intramuscular Given 04/24/22 1343)  oxyCODONE (Oxy IR/ROXICODONE) immediate release tablet 5 mg (5 mg Oral Given 04/24/22 1342)    ED Course/ Medical Decision Making/ A&P                           Medical Decision Making Amount and/or Complexity of Data Reviewed Labs: ordered. Radiology: ordered.  Risk Prescription drug management.   Carla Boone is here with right upper back pain.  Normal vitals.  No fever.  EKG per my review and interpretation shows sinus rhythm.  Chest x-ray ordered showed no pneumonia or pneumothorax per my review and interpretation.  Pregnancy test negative.  Overall this appears to be a musculoskeletal process.  Pain in the subscapularis muscles and right trapezius muscles and right  paraspinal cervical muscles.  Likely overuse injury.  Neurovascular neuromuscular intact.  No concern for fracture or spinal cord issue.  She is PERC negative and doubt PE.  No cardiac risk factors and reassuring EKG and doubt ACS.  No infectious process on chest x-ray.  Will treat with steroids, muscle relaxant, narcotics for breakthrough pain, Tylenol.  We will have her follow-up with sports medicine.  Discharged in good condition.  This chart was dictated using voice recognition software.  Despite best efforts to proofread,  errors can occur which can change the documentation meaning.  Final Clinical Impression(s) / ED Diagnoses Final diagnoses:  Acute right-sided thoracic back pain  Muscle spasm of back    Rx / DC Orders ED Discharge Orders          Ordered    oxyCODONE (ROXICODONE) 5 MG immediate release tablet  Every 6 hours PRN        04/24/22 1334    methylPREDNISolone (MEDROL DOSEPAK) 4 MG TBPK tablet        04/24/22 1334    cyclobenzaprine (FLEXERIL) 10 MG tablet  2 times daily PRN        04/24/22 1334              Lurie Mullane, DO 04/24/22 1355

## 2022-04-24 NOTE — ED Notes (Signed)
ED Provider at bedside. 

## 2022-04-24 NOTE — ED Triage Notes (Signed)
Pain in right upper back, under shoulder blade, since Friday. Pain progressively worsened. Movement and deep inhalation worsens pain. Denies shob, cp.

## 2022-05-11 ENCOUNTER — Ambulatory Visit (INDEPENDENT_AMBULATORY_CARE_PROVIDER_SITE_OTHER): Payer: 59 | Admitting: Family Medicine

## 2022-05-11 ENCOUNTER — Encounter: Payer: Self-pay | Admitting: Family Medicine

## 2022-05-11 VITALS — BP 138/98 | Ht 63.0 in | Wt 180.0 lb

## 2022-05-11 DIAGNOSIS — M5412 Radiculopathy, cervical region: Secondary | ICD-10-CM

## 2022-05-11 MED ORDER — GABAPENTIN 300 MG PO CAPS: 300.0000 mg | ORAL_CAPSULE | Freq: Three times a day (TID) | ORAL | 1 refills | Status: AC

## 2022-05-11 NOTE — Patient Instructions (Signed)
Nice to meet you Please continue heat. Please try the exercises. Please start with taking the gabapentin 1 pill at night.  You can increase 2-3 times daily as you tolerate. Please send me a message in MyChart with any questions or updates.  Please see me back in 3-4 weeks.   --Dr. Jordan Likes

## 2022-05-11 NOTE — Assessment & Plan Note (Signed)
Acutely occurring.  Symptoms most consistent with radicular pain with the ultra sensation down the right arm.  Positive Spurling's test on physical exam today. -Counseled on home exercise therapy and supportive care. -Gabapentin. -Could consider further imaging or physical therapy.

## 2022-05-11 NOTE — Progress Notes (Signed)
  Carla Boone - 46 y.o. female MRN [id number]  Date of birth: 1976-06-12  SUBJECTIVE:  Including CC & ROS.  No chief complaint on file.   Carla Boone is a 46 y.o. female that is presenting with acute right neck and arm pain.  The pain has been ongoing for over a month.  Having altered sensation down the right arm and it feels like a toothache.  Denies any history of similar pain.  No history of neck or shoulder surgery.  Review of the emergency department note from 5/21 shows she was provided oxycodone with a Medrol Dosepak and Flexeril. Independent review of the chest x-ray from 5/21 shows no acute changes.  Review of Systems See HPI   HISTORY: Past Medical, Surgical, Social, and Family History Reviewed & Updated per EMR.   Pertinent Historical Findings include:  Past Medical History:  Diagnosis Date   Asthma     Past Surgical History:  Procedure Laterality Date   APPENDECTOMY       PHYSICAL EXAM:  VS: BP (!) 138/98 (BP Location: Left Arm, Patient Position: Sitting)   Ht 5\' 3"  (1.6 m)   Wt 180 lb (81.6 kg)   LMP  (LMP Unknown)   BMI 31.89 kg/m  Physical Exam Gen: NAD, alert, cooperative with exam, well-appearing MSK: Neurovascularly intact       ASSESSMENT & PLAN:   Cervical radiculopathy Acutely occurring.  Symptoms most consistent with radicular pain with the ultra sensation down the right arm.  Positive Spurling's test on physical exam today. -Counseled on home exercise therapy and supportive care. -Gabapentin. -Could consider further imaging or physical therapy.

## 2022-06-09 ENCOUNTER — Ambulatory Visit: Payer: 59 | Admitting: Family Medicine

## 2023-03-20 ENCOUNTER — Encounter: Payer: Self-pay | Admitting: *Deleted

## 2023-10-07 IMAGING — DX DG CHEST 1V PORT
1 series · 1 of 1 positions shown · non-contrast
Comparison: November 05, 2017

CLINICAL DATA: Pain in right upper back.

EXAM:
PORTABLE CHEST 1 VIEW

[chest ap]
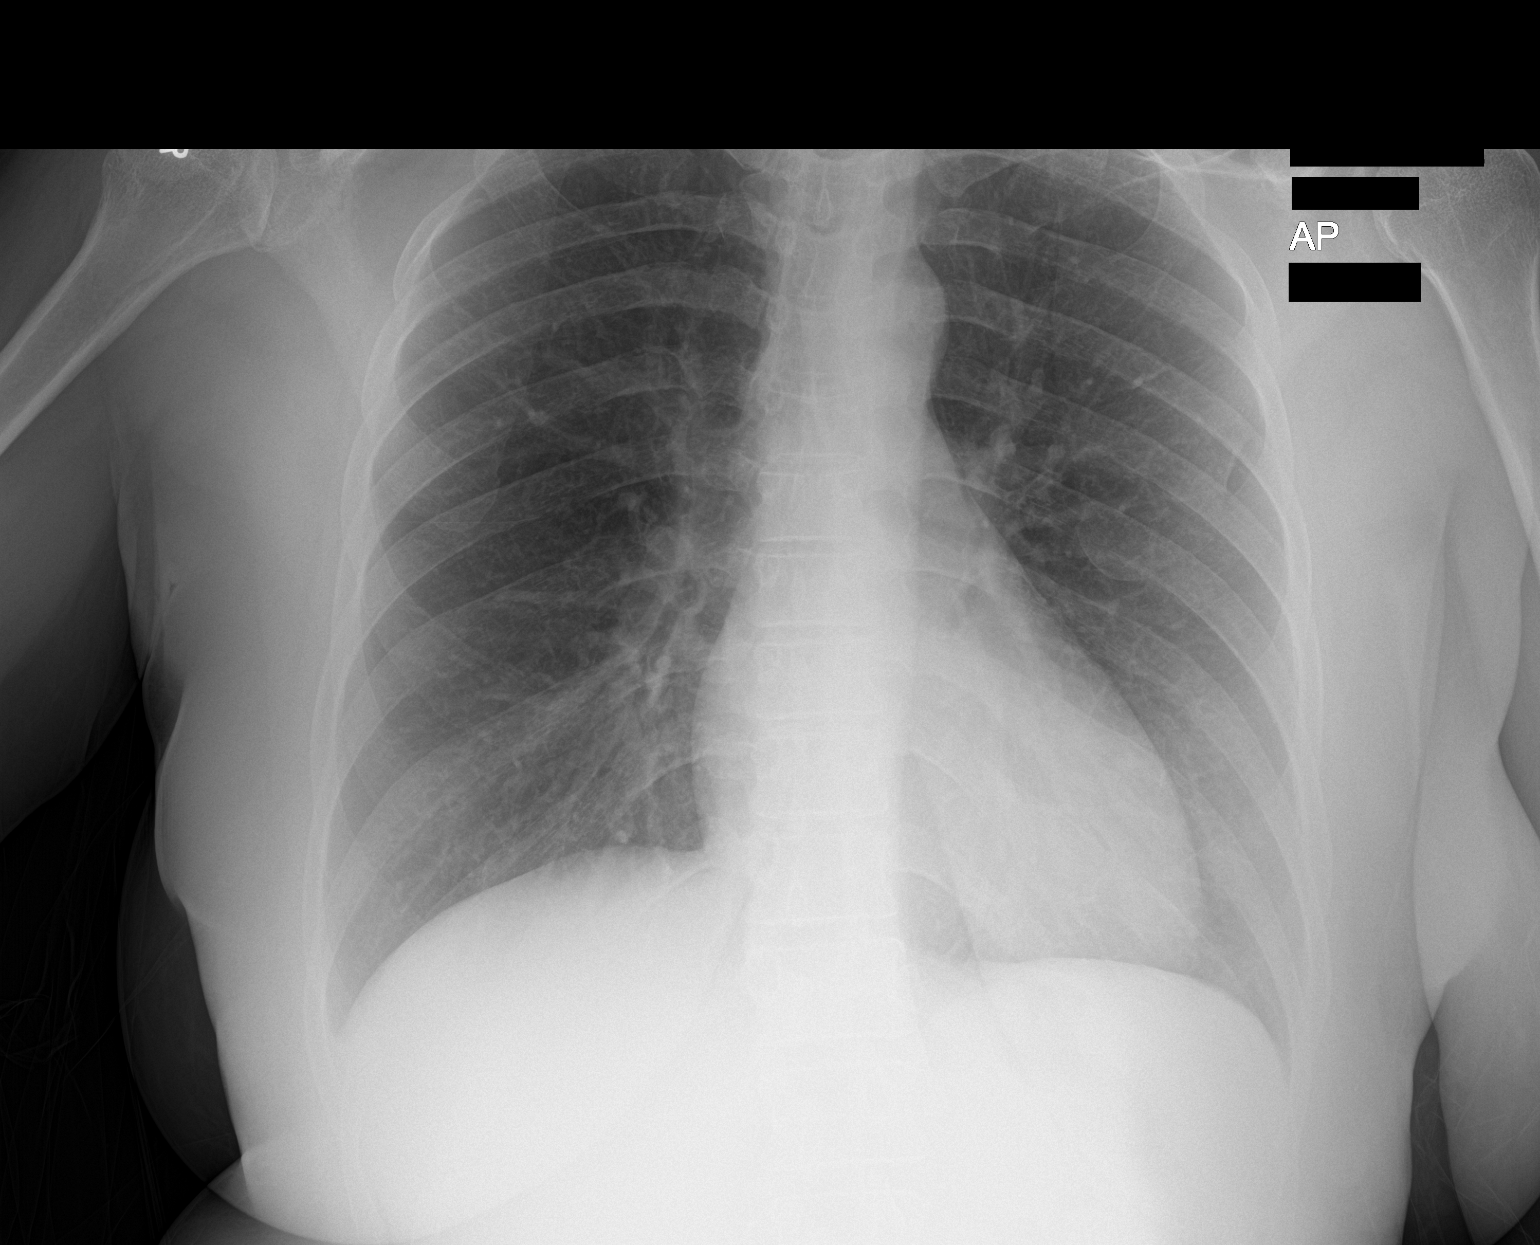

[1 of 1 positions shown; findings below may reference images not displayed]

FINDINGS: The heart size and mediastinal contours are within normal limits.
Both lungs are clear. The visualized skeletal structures are
unremarkable.
IMPRESSION: No active disease.

## 2024-07-09 ENCOUNTER — Other Ambulatory Visit: Payer: Self-pay

## 2024-07-09 ENCOUNTER — Encounter (HOSPITAL_BASED_OUTPATIENT_CLINIC_OR_DEPARTMENT_OTHER): Payer: Self-pay | Admitting: Emergency Medicine

## 2024-07-09 ENCOUNTER — Emergency Department (HOSPITAL_BASED_OUTPATIENT_CLINIC_OR_DEPARTMENT_OTHER)
Admission: EM | Admit: 2024-07-09 | Discharge: 2024-07-09 | Disposition: A | Discharge status: Home / Self Care | Attending: Emergency Medicine | Admitting: Emergency Medicine

## 2024-07-09 DIAGNOSIS — N3000 Acute cystitis without hematuria: Secondary | ICD-10-CM | POA: Insufficient documentation

## 2024-07-09 DIAGNOSIS — R350 Frequency of micturition: Secondary | ICD-10-CM | POA: Diagnosis present

## 2024-07-09 LAB — URINALYSIS, W/ REFLEX TO CULTURE (INFECTION SUSPECTED)
Glucose, UA: NEGATIVE mg/dL
Ketones, ur: NEGATIVE mg/dL
Nitrite: POSITIVE — AB
Protein, ur: 100 mg/dL — AB
Specific Gravity, Urine: >=1.03 (ref 1.005–1.030)
WBC, UA: >50 WBC/hpf (ref 0–5)
pH: 6 (ref 5.0–8.0)

## 2024-07-09 LAB — PREGNANCY, URINE: Preg Test, Ur: NEGATIVE

## 2024-07-09 MED ORDER — CEPHALEXIN 500 MG PO CAPS: 500.0000 mg | ORAL_CAPSULE | Freq: Four times a day (QID) | ORAL | 0 refills | Status: AC

## 2024-07-09 MED ORDER — CEPHALEXIN 250 MG PO CAPS
500.0000 mg | ORAL_CAPSULE | Freq: Once | ORAL | Status: AC
  Administered 2024-07-09: 500 mg via ORAL
  Filled 2024-07-09: qty 2

## 2024-07-09 NOTE — ED Provider Notes (Signed)
 [place] [facility] Provider Note   CSN: [id number] Arrival date & time: 07/09/24  1924     Patient presents with: Urinary Frequency   Carla Boone is a 48 y.o. female.   Patient with history of recurrent UTI. She recently developed symptoms, was given macrobid on 7/18. Completed course of antibiotics. Urinary frequency has since returned. Patient states it feels like somewhat is pressing on her bladder. Urine discolored. No fever, chills, or back pain. No pain or burning with urination.  The history is provided by the patient.  Urinary Frequency This is a recurrent problem. The current episode started more than 1 week ago. The problem has been gradually worsening.       Prior to Admission medications   Medication Sig Start Date End Date Taking? Authorizing Provider  benzonatate  (TESSALON ) 100 MG capsule Take 1 capsule (100 mg total) by mouth every 8 (eight) hours. 11/05/17   Law, Alexandra M, PA-C  cephALEXin  (KEFLEX ) 500 MG capsule Take 1 capsule (500 mg total) by mouth 4 (four) times daily. 02/12/18   Lenor Hollering, MD  cetirizine -pseudoephedrine  (ZYRTEC -D) 5-120 MG per tablet Take 1 tablet by mouth daily. 03/25/15   Palumbo, April, MD  chlorpheniramine-HYDROcodone (TUSSIONEX PENNKINETIC ER) 10-8 MG/5ML SUER Take 5 mLs by mouth every 12 (twelve) hours as needed. 02/27/16   Molpus, John, MD  cyclobenzaprine  (FLEXERIL ) 10 MG tablet Take 1 tablet (10 mg total) by mouth 2 (two) times daily as needed for muscle spasms. 04/24/22   Curatolo, Adam, DO  fluticasone  (FLONASE ) 50 MCG/ACT nasal spray Place 2 sprays into both nostrils daily. 03/25/15   Palumbo, April, MD  gabapentin  (NEURONTIN ) 300 MG capsule Take 1 capsule (300 mg total) by mouth 3 (three) times daily. 05/11/22   Chick Venetia BRAVO, MD  methylPREDNISolone  (MEDROL  DOSEPAK) 4 MG TBPK tablet Follow package insert 04/24/22   Curatolo, Adam, DO  naproxen  (NAPROSYN ) 375 MG tablet Take 1 tablet (375 mg  total) by mouth 2 (two) times daily. 03/25/15   Palumbo, April, MD  ondansetron  (ZOFRAN  ODT) 8 MG disintegrating tablet Take 1 tablet (8 mg total) by mouth every 8 (eight) hours as needed for nausea or vomiting. 02/27/16   Molpus, Norleen, MD  predniSONE  (DELTASONE ) 20 MG tablet Take 3 tablets (60 mg total) by mouth daily with breakfast. 11/05/17   Law, Lorane HERO, PA-C  trimethoprim -polymyxin b  (POLYTRIM ) ophthalmic solution Place 1 drop into both eyes every 4 (four) hours. 01/02/18   Delwyn Damien PARAS, PA-C    Allergies: Other and Oxycodone -acetaminophen     Review of Systems  Genitourinary:  Positive for frequency and urgency. Negative for difficulty urinating and hematuria.  All other systems reviewed and are negative.   Updated Vital Signs BP (!) 142/93 (BP Location: Right Arm)   Pulse 81   Temp 97.9 F (36.6 C)   Resp 18   Ht 5' 3 (1.6 m)   Wt 75.8 kg   LMP 06/21/2024 (Approximate)   SpO2 100%   BMI 29.58 kg/m   Physical Exam Constitutional:      Appearance: Normal appearance.  HENT:     Head: Normocephalic.  Eyes:     Conjunctiva/sclera: Conjunctivae normal.  Cardiovascular:     Rate and Rhythm: Normal rate.  Pulmonary:     Effort: Pulmonary effort is normal.  Abdominal:     Palpations: Abdomen is soft.     Tenderness: There is no right CVA tenderness or left CVA tenderness.  Musculoskeletal:  General: Normal range of motion.  Skin:    General: Skin is warm and dry.  Neurological:     Mental Status: She is alert and oriented to person, place, and time.  Psychiatric:        Mood and Affect: Mood normal.        Behavior: Behavior normal.     (all labs ordered are listed, but only abnormal results are displayed) Labs Reviewed  URINALYSIS, W/ REFLEX TO CULTURE (INFECTION SUSPECTED) - Abnormal; Notable for the following components:      Result Value   APPearance CLOUDY (*)    Hgb urine dipstick TRACE (*)    Bilirubin Urine SMALL (*)    Protein, ur 100 (*)     Nitrite POSITIVE (*)    Leukocytes,Ua MODERATE (*)    Bacteria, UA MANY (*)    All other components within normal limits  URINE CULTURE  PREGNANCY, URINE    EKG: None  Radiology: No results found.   Procedures   Medications Ordered in the ED  cephALEXin  (KEFLEX ) capsule 500 mg (has no administration in time range)                                    Medical Decision Making Amount and/or Complexity of Data Reviewed Labs: ordered.  Risk Prescription drug management.   Pt diagnosed with a UTI. Pt is afebrile, without tachycardia, hypotension, or other signs of serious infection.  Pt to be dc home with antibiotics and instructions to follow up with PCP if symptoms persist. Urine culture requested. Patient will be contacted if culture results indicate a different antibiotic is needed. Discussed return precautions. Pt appears safe for discharge.      Final diagnoses:  Acute cystitis without hematuria    ED Discharge Orders          Ordered    cephALEXin  (KEFLEX ) 500 MG capsule  4 times daily        07/09/24 2035               Claudene Lenis, NP 07/09/24 2111    Towana Ozell BROCKS, MD 07/10/24 [phone]

## 2024-07-09 NOTE — Discharge Instructions (Addendum)
 Take antibiotic as directed. You will be contacted if the culture results indicate a different antibiotic is needed. Follow-up with your primary care provider.

## 2024-07-09 NOTE — ED Triage Notes (Signed)
 Pt reports recurrent UTI's. Reports she feels like she is getting another UTI. 7/18 was given macrobid, taken x 5 days. Sx returned once prescription was complete.   C/o increased urinary frequency. Denies fever, back pain.

## 2024-07-10 LAB — URINE CULTURE

## 2024-12-01 ENCOUNTER — Encounter (HOSPITAL_BASED_OUTPATIENT_CLINIC_OR_DEPARTMENT_OTHER): Payer: Self-pay

## 2024-12-01 ENCOUNTER — Emergency Department (HOSPITAL_BASED_OUTPATIENT_CLINIC_OR_DEPARTMENT_OTHER)
Admission: EM | Admit: 2024-12-01 | Discharge: 2024-12-01 | Disposition: A | Discharge status: Home / Self Care | Attending: Emergency Medicine | Admitting: Emergency Medicine

## 2024-12-01 ENCOUNTER — Emergency Department (HOSPITAL_BASED_OUTPATIENT_CLINIC_OR_DEPARTMENT_OTHER)

## 2024-12-01 ENCOUNTER — Other Ambulatory Visit: Payer: Self-pay

## 2024-12-01 DIAGNOSIS — N644 Mastodynia: Secondary | ICD-10-CM | POA: Diagnosis present

## 2024-12-01 DIAGNOSIS — N611 Abscess of the breast and nipple: Secondary | ICD-10-CM | POA: Insufficient documentation

## 2024-12-01 MED ORDER — IBUPROFEN 800 MG PO TABS
800.0000 mg | ORAL_TABLET | Freq: Once | ORAL | Status: AC
  Administered 2024-12-01: 800 mg via ORAL
  Filled 2024-12-01: qty 1

## 2024-12-01 MED ORDER — CEPHALEXIN 500 MG PO CAPS: 500.0000 mg | ORAL_CAPSULE | Freq: Four times a day (QID) | ORAL | 0 refills | Status: AC

## 2024-12-01 MED ORDER — CEPHALEXIN 250 MG PO CAPS
500.0000 mg | ORAL_CAPSULE | Freq: Once | ORAL | Status: AC
  Administered 2024-12-01: 500 mg via ORAL
  Filled 2024-12-01: qty 2

## 2024-12-01 MED ORDER — FLUCONAZOLE 200 MG PO TABS: 200.0000 mg | ORAL_TABLET | Freq: Every day | ORAL | 0 refills | Status: AC | PRN

## 2024-12-01 NOTE — ED Provider Notes (Signed)
 " [place] [facility] Provider Note   CSN: [id number] Arrival date & time: 12/01/24  9389     Patient presents with: Breast Pain   Carla Boone is a 48 y.o. female here with left breast pain and drainage.   Reports having swelling and drainage in past 2 months, told she may have infection vs abscess vs inflammation of the left breast.  Her symptoms returned significantly in the past few days, very painful, some spontaneous drainage from near the areola of the left breast.  Not on antibiotics.  Her OBGYN provider is out of town.   HPI     Prior to Admission medications  Medication Sig Start Date End Date Taking? Authorizing Provider  cephALEXin  (KEFLEX ) 500 MG capsule Take 1 capsule (500 mg total) by mouth 4 (four) times daily for 7 days. 12/01/24 12/08/24 Yes Andalyn Heckstall, Donnice PARAS, MD  fluconazole  (DIFLUCAN ) 200 MG tablet Take 1 tablet (200 mg total) by mouth daily as needed for up to 1 dose. 12/01/24  Yes Remedios Mckone, Donnice PARAS, MD  benzonatate  (TESSALON ) 100 MG capsule Take 1 capsule (100 mg total) by mouth every 8 (eight) hours. 11/05/17   Law, Alexandra M, PA-C  cephALEXin  (KEFLEX ) 500 MG capsule Take 1 capsule (500 mg total) by mouth 4 (four) times daily. 07/09/24   Claudene Lenis, NP  cetirizine -pseudoephedrine  (ZYRTEC -D) 5-120 MG per tablet Take 1 tablet by mouth daily. 03/25/15   Palumbo, April, MD  chlorpheniramine-HYDROcodone (TUSSIONEX PENNKINETIC ER) 10-8 MG/5ML SUER Take 5 mLs by mouth every 12 (twelve) hours as needed. 02/27/16   Molpus, John, MD  cyclobenzaprine  (FLEXERIL ) 10 MG tablet Take 1 tablet (10 mg total) by mouth 2 (two) times daily as needed for muscle spasms. 04/24/22   Curatolo, Adam, DO  fluticasone  (FLONASE ) 50 MCG/ACT nasal spray Place 2 sprays into both nostrils daily. 03/25/15   Palumbo, April, MD  gabapentin  (NEURONTIN ) 300 MG capsule Take 1 capsule (300 mg total) by mouth 3 (three) times daily. 05/11/22   Chick Venetia BRAVO, MD   methylPREDNISolone  (MEDROL  DOSEPAK) 4 MG TBPK tablet Follow package insert 04/24/22   Curatolo, Adam, DO  naproxen  (NAPROSYN ) 375 MG tablet Take 1 tablet (375 mg total) by mouth 2 (two) times daily. 03/25/15   Palumbo, April, MD  ondansetron  (ZOFRAN  ODT) 8 MG disintegrating tablet Take 1 tablet (8 mg total) by mouth every 8 (eight) hours as needed for nausea or vomiting. 02/27/16   Molpus, Norleen, MD  predniSONE  (DELTASONE ) 20 MG tablet Take 3 tablets (60 mg total) by mouth daily with breakfast. 11/05/17   Law, Lorane HERO, PA-C  trimethoprim -polymyxin b  (POLYTRIM ) ophthalmic solution Place 1 drop into both eyes every 4 (four) hours. 01/02/18   Delwyn Damien PARAS, PA-C    Allergies: Other and Oxycodone -acetaminophen     Review of Systems  Updated Vital Signs BP (!) 155/93   Pulse 88   Temp 98.1 F (36.7 C) (Oral)   LMP 11/19/2024 (Approximate)   SpO2 100%   Physical Exam Constitutional:      General: She is not in acute distress. HENT:     Head: Normocephalic and atraumatic.  Eyes:     Conjunctiva/sclera: Conjunctivae normal.     Pupils: Pupils are equal, round, and reactive to light.  Cardiovascular:     Rate and Rhythm: Normal rate and regular rhythm.  Pulmonary:     Effort: Pulmonary effort is normal. No respiratory distress.  Skin:    General: Skin is warm and dry.  Comments: Breast exam performed with RN Shelba present Tenderness of left breast without visible erythema, mild engorgement, no drainage from breast, no easily palpable fluctuant pocket  Neurological:     General: No focal deficit present.     Mental Status: She is alert. Mental status is at baseline.  Psychiatric:        Mood and Affect: Mood normal.        Behavior: Behavior normal.     (all labs ordered are listed, but only abnormal results are displayed) Labs Reviewed - No data to display  EKG: None  Radiology: US  CHEST SOFT TISSUE Result Date: 12/01/2024 CLINICAL DATA:  Left nipple discharge at  site of previous piercing. Evaluate for abscess. EXAM: ULTRASOUND OF CHEST SOFT TISSUES TECHNIQUE: Ultrasound examination of the chest wall soft tissues was performed in the area of clinical concern in the left breast. COMPARISON:  None Available. FINDINGS: Swelling and heterogeneous echotexture of the left nipple is seen. A small heterogeneous collection is seen in the subareolar region measuring 1.2 x 0.8 cm, suspicious for small abscess. IMPRESSION: Swelling of left nipple, with 1.2 cm heterogeneous collection in the subareolar region, suspicious for small abscess. Electronically Signed   By: Norleen DELENA Kil M.D.   On: 12/01/2024 10:58     Procedures   Medications Ordered in the ED  ibuprofen  (ADVIL ) tablet 800 mg (800 mg Oral Given 12/01/24 0718)  cephALEXin  (KEFLEX ) capsule 500 mg (500 mg Oral Given 12/01/24 9281)                                    Medical Decision Making Amount and/or Complexity of Data Reviewed Radiology: ordered.  Risk Prescription drug management.   Ultrasound imaging reviewed concerning for 1.2 cm heterogenous fluid collection behind the left nipple, consistent with potential abscess.  I did start the patient on Keflex  for 7 days.  I have attempted to place referral to the breast imaging center per current protocol as she may benefit from needle aspiration, with the abscess being over 1 cm.  However the abscess less than 5 cm and not requiring general surgery referral.  She has not failed outpatient antibiotics and is not requiring IV antibiotics or admission at this time.  The patient is comfortable this plan will be discharged.     Final diagnoses:  Breast abscess    ED Discharge Orders          Ordered    US  BREAST ASPIRATION LEFT       Comments: ED referral protocol, Dr Ebbie   12/01/24 1105    cephALEXin  (KEFLEX ) 500 MG capsule  4 times daily        12/01/24 1108    fluconazole  (DIFLUCAN ) 200 MG tablet  Daily PRN        12/01/24 1108                Cottie Donnice PARAS, MD 12/01/24 1109  "

## 2024-12-01 NOTE — ED Triage Notes (Signed)
 Pt states that she began having pain and swelling in her left breast x 1 month ago and PMD sent her for a mammogram and a pocket of fluid was found behind her nipple at site of previous piercing. Pt reports she continued to have pain and swelling and last week was able to express thick yellow drainage from old piercing hole. Pt is here for continued pain because her PMD is out of town

## 2024-12-01 NOTE — Discharge Instructions (Addendum)
 You have a 1.2 cm fluid collection (potential small abscess) behind your left nipple.  I started you on an antibiotic which she will need to take as prescribed 4 times a day for a week.  I also placed a referral to the breast imaging [facility] [place].  Hopefully this can help you set up an appointment, for possible needle aspiration or drainage of your breast.  Please call the number above if you do not hear back from them within 1 business day

## 2024-12-02 ENCOUNTER — Other Ambulatory Visit: Payer: Self-pay | Admitting: General Surgery

## 2024-12-02 DIAGNOSIS — N611 Abscess of the breast and nipple: Secondary | ICD-10-CM
